# Patient Record
Sex: Male | Born: 1980 | Race: White | Hispanic: No | Marital: Single | Smoking: Current every day smoker
Health system: Southern US, Community
[De-identification: ages and names within clinical notes are randomized; demographics above are authoritative.]

## PROBLEM LIST (undated history)

## (undated) HISTORY — PX: SPINAL FUSION: SHX223

---

## 2006-07-16 ENCOUNTER — Emergency Department (HOSPITAL_COMMUNITY): Admission: EM | Admit: 2006-07-16 | Discharge: 2006-07-17 | Payer: Self-pay | Admitting: Emergency Medicine

## 2010-08-03 ENCOUNTER — Emergency Department (INDEPENDENT_AMBULATORY_CARE_PROVIDER_SITE_OTHER): Payer: PRIVATE HEALTH INSURANCE

## 2010-08-03 ENCOUNTER — Emergency Department (HOSPITAL_BASED_OUTPATIENT_CLINIC_OR_DEPARTMENT_OTHER)
Admission: EM | Admit: 2010-08-03 | Discharge: 2010-08-03 | Disposition: A | Discharge status: Home / Self Care | Payer: PRIVATE HEALTH INSURANCE | Attending: Emergency Medicine | Admitting: Emergency Medicine

## 2010-08-03 DIAGNOSIS — M542 Cervicalgia: Secondary | ICD-10-CM

## 2010-09-21 ENCOUNTER — Ambulatory Visit (INDEPENDENT_AMBULATORY_CARE_PROVIDER_SITE_OTHER): Payer: PRIVATE HEALTH INSURANCE | Admitting: Family Medicine

## 2010-09-21 ENCOUNTER — Encounter: Payer: Self-pay | Admitting: Family Medicine

## 2010-09-21 VITALS — BP 130/87 | HR 89 | Ht 72.0 in | Wt 190.0 lb

## 2010-09-21 DIAGNOSIS — R252 Cramp and spasm: Secondary | ICD-10-CM

## 2010-09-21 DIAGNOSIS — M542 Cervicalgia: Secondary | ICD-10-CM

## 2010-09-21 MED ORDER — CYCLOBENZAPRINE HCL 5 MG PO TABS: 5.0000 mg | ORAL_TABLET | Freq: Three times a day (TID) | ORAL | Status: AC | PRN

## 2010-09-21 MED ORDER — PREDNISONE (PAK) 10 MG PO TABS: ORAL_TABLET | ORAL | Status: DC

## 2010-09-21 MED ORDER — MELOXICAM 15 MG PO TABS: 15.0000 mg | ORAL_TABLET | Freq: Every day | ORAL | Status: AC

## 2010-09-21 MED ORDER — TRAMADOL HCL 50 MG PO TABS: 50.0000 mg | ORAL_TABLET | Freq: Three times a day (TID) | ORAL | Status: AC | PRN

## 2010-09-21 NOTE — Progress Notes (Signed)
  Subjective:    Patient ID: Gilbert Howard, male    DOB: 12-May-1981, 29 y.o.   MRN: [id number]  HPI 30 yo M here for right sided neck pain  Patient reports no known injury. States about 2 1/2 - 3 months ago started to have intermittent sharp right sided neck pain about the trapezius that radiated into neck, associated with headaches. Happens about every other day. Lasts for several hours. No numbness or tingling. Radiated into right arm once. Pain improved with right arm overhead. H/o cervical strain a few years ago but this resolved. Taking ibuprofen regularly which helps some Went to ED on 3/15 and had c-spine CT which was negative for any abnormalities.  A remote x-ray was also normal. Given percocet and naprosyn from ED - took percocet but not naprosyn. No prior treatment other than ED.  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  Sweats a great deal and cannot stay hydrated despite drinking lots of water.  Salt caked on body by end of day.  Calves cramp up as well.  History reviewed. No pertinent past medical history.  No current outpatient prescriptions on file prior to visit.    History reviewed. No pertinent past surgical history.  Allergies  Allergen Reactions  . Codeine     History   Social History  . Marital Status: Single    Spouse Name: N/A    Number of Children: N/A  . Years of Education: N/A   Occupational History  . Not on file.   Social History Main Topics  . Smoking status: Current Everyday Smoker  . Smokeless tobacco: Not on file  . Alcohol Use: Not on file  . Drug Use: Not on file  . Sexually Active: Not on file   Other Topics Concern  . Not on file   Social History Narrative  . No narrative on file    Family History  Problem Relation Age of Onset  . Diabetes Neg Hx   . Heart attack Neg Hx   . Hypertension Neg Hx     BP 130/87  Pulse 89  Ht 6' (1.829 m)  Wt 190 lb (86.183 kg)  BMI 25.77  kg/m2  Review of Systems See HPI above.    Objective:   Physical Exam Gen: NAD Neck: No gross deformity, swelling, or bruising. TTP right paraspinal cervical region and in trapezius.  No midline/bony or left paraspinal TTP. FROM without pain including lateral rotations. Strength 5/5 BUEs Trace reflexes bilaterally in biceps, triceps, brachioradialis tendons Sensation intact to light touch Negative spurlings bilaterally.  R shoulder: FROM without pain or weakness Negative hawkins 5/5 strength on empty can, resisted IR and ER without pain in shoulder.     Assessment & Plan:  1. Neck pain - without injury and normal CT, this is most likely cervical strain that has persisted.  Though bulging disc with radiculopathy is possible.  Will treat conservatively initially.  Prednisone dose pack with transition to mobic.  Tramadol as needed for pain, flexeril for muscle spasms.  Start PT for up to 6 weeks for rom, stretching, exercises, modalities.  F/u in 6 weeks.  If not improving will consider MRI.  2. Muscle cramps - 2/2 dehydration, heavy salt and sweat loss.  Discussed measures to replete fluids and electrolytes during work.  Discussed weighing himself before and afterwards would be beneficial as well.  See instructions for details.

## 2010-09-21 NOTE — Assessment & Plan Note (Signed)
Without injury and normal CT, this is most likely cervical strain that has persisted.  Though bulging disc with radiculopathy is possible.  Will treat conservatively initially.  Prednisone dose pack with transition to mobic.  Tramadol as needed for pain, flexeril for muscle spasms.  Start PT for up to 6 weeks for rom, stretching, exercises, modalities.  F/u in 6 weeks.  If not improving will consider MRI.

## 2010-09-21 NOTE — Patient Instructions (Signed)
Start prednisone 6 day dose pack with food as directed. The day after you finish prednisone, start meloxicam 15mg  daily with food for pain and to relieve irritation/inflammation of the nerve. Flexeril three times a day as needed for muscle spasms (can make you sleepy - if so do not drive while taking this). Tramadol for severe pain (no driving on this medicine). Start physical therapy - go at least 2-3 visits but can go up to 6 total weeks. Regardless, do exercises and stretches that they show you. Heat 15 minutes at a time 3-4 times a day to help with spasms. Watch head position when on computers, texting, when sleeping in bed - should in line with back to prevent further nerve traction and irritation. Consider home traction unit if you get benefit with this in physical therapy. If not improving we will consider an MRI, injections, other medications, or surgery depending on how you are doing and what further imaging shows.  For muscle cramps you should drink 20 ounces of water 2 hours prior to work, 8 ounces 15 minutes before work.  Then alternate gatorade and water hourly and drink 4-8 ounces each hour. If possible, weight yourself before work and after work - the difference in pounds (if you've lost weight) should correlate to you needing to drink 20 ounces more for every pound you lost. You can add a tablespoon of salt to gatorade as well. Follow up with me in 6 weeks.

## 2010-09-21 NOTE — Assessment & Plan Note (Signed)
2/2 dehydration, heavy salt and sweat loss.  Discussed measures to replete fluids and electrolytes during work.  Discussed weighing himself before and afterwards would be beneficial as well.  See instructions for details.

## 2010-09-22 ENCOUNTER — Emergency Department (HOSPITAL_BASED_OUTPATIENT_CLINIC_OR_DEPARTMENT_OTHER)
Admission: EM | Admit: 2010-09-22 | Discharge: 2010-09-23 | Disposition: A | Discharge status: Home / Self Care | Payer: Worker's Compensation | Attending: Emergency Medicine | Admitting: Emergency Medicine

## 2010-09-22 DIAGNOSIS — Z79899 Other long term (current) drug therapy: Secondary | ICD-10-CM | POA: Insufficient documentation

## 2010-09-22 DIAGNOSIS — Y9289 Other specified places as the place of occurrence of the external cause: Secondary | ICD-10-CM | POA: Insufficient documentation

## 2010-09-22 DIAGNOSIS — X500XXA Overexertion from strenuous movement or load, initial encounter: Secondary | ICD-10-CM | POA: Insufficient documentation

## 2010-09-22 DIAGNOSIS — F17200 Nicotine dependence, unspecified, uncomplicated: Secondary | ICD-10-CM | POA: Insufficient documentation

## 2010-09-22 DIAGNOSIS — M25519 Pain in unspecified shoulder: Secondary | ICD-10-CM | POA: Insufficient documentation

## 2010-09-23 ENCOUNTER — Emergency Department (INDEPENDENT_AMBULATORY_CARE_PROVIDER_SITE_OTHER): Payer: Worker's Compensation

## 2010-09-23 DIAGNOSIS — X500XXA Overexertion from strenuous movement or load, initial encounter: Secondary | ICD-10-CM

## 2010-09-23 DIAGNOSIS — M25579 Pain in unspecified ankle and joints of unspecified foot: Secondary | ICD-10-CM

## 2010-11-02 ENCOUNTER — Ambulatory Visit: Payer: PRIVATE HEALTH INSURANCE | Admitting: Family Medicine

## 2014-07-29 ENCOUNTER — Other Ambulatory Visit: Payer: Self-pay | Admitting: Physician Assistant

## 2014-07-29 DIAGNOSIS — M545 Low back pain: Secondary | ICD-10-CM

## 2014-08-03 ENCOUNTER — Ambulatory Visit
Admission: RE | Admit: 2014-08-03 | Discharge: 2014-08-03 | Disposition: A | Discharge status: Home / Self Care | Payer: BLUE CROSS/BLUE SHIELD | Source: Ambulatory Visit | Attending: Physician Assistant | Admitting: Physician Assistant

## 2014-08-03 DIAGNOSIS — M545 Low back pain: Secondary | ICD-10-CM

## 2015-03-25 ENCOUNTER — Other Ambulatory Visit: Payer: Self-pay | Admitting: Orthopedic Surgery

## 2015-03-25 DIAGNOSIS — Q762 Congenital spondylolisthesis: Secondary | ICD-10-CM

## 2015-04-13 ENCOUNTER — Ambulatory Visit
Admission: RE | Admit: 2015-04-13 | Discharge: 2015-04-13 | Disposition: A | Discharge status: Home / Self Care | Payer: BLUE CROSS/BLUE SHIELD | Source: Ambulatory Visit | Attending: Orthopedic Surgery | Admitting: Orthopedic Surgery

## 2015-04-13 DIAGNOSIS — Q762 Congenital spondylolisthesis: Secondary | ICD-10-CM

## 2015-12-08 ENCOUNTER — Encounter: Payer: Self-pay | Admitting: Emergency Medicine

## 2015-12-08 ENCOUNTER — Emergency Department (INDEPENDENT_AMBULATORY_CARE_PROVIDER_SITE_OTHER)
Admission: EM | Admit: 2015-12-08 | Discharge: 2015-12-08 | Disposition: A | Discharge status: Home / Self Care | Payer: Self-pay | Source: Home / Self Care | Attending: Family Medicine | Admitting: Family Medicine

## 2015-12-08 DIAGNOSIS — Z720 Tobacco use: Secondary | ICD-10-CM

## 2015-12-08 DIAGNOSIS — F17200 Nicotine dependence, unspecified, uncomplicated: Secondary | ICD-10-CM

## 2015-12-08 DIAGNOSIS — J069 Acute upper respiratory infection, unspecified: Secondary | ICD-10-CM

## 2015-12-08 DIAGNOSIS — Z77120 Contact with and (suspected) exposure to mold (toxic): Secondary | ICD-10-CM

## 2015-12-08 MED ORDER — BENZONATATE 100 MG PO CAPS: 100.0000 mg | ORAL_CAPSULE | Freq: Three times a day (TID) | ORAL | Status: DC

## 2015-12-08 MED ORDER — AZITHROMYCIN 250 MG PO TABS: 250.0000 mg | ORAL_TABLET | Freq: Every day | ORAL | Status: DC

## 2015-12-08 NOTE — ED Notes (Signed)
Productive cough with white mucus x 2 weeks, sinus headache, exposure to mold, lungs feel cool on deep inhalation.

## 2015-12-08 NOTE — Discharge Instructions (Signed)
Cool Mist Vaporizers  Vaporizers may help relieve the symptoms of a cough and cold. They add moisture to the air, which helps mucus to become thinner and less sticky. This makes it easier to breathe and cough up secretions. Cool mist vaporizers do not cause serious burns like hot mist vaporizers, which may also be called steamers or humidifiers. Vaporizers have not been proven to help with colds. You should not use a vaporizer if you are allergic to mold.  HOME CARE INSTRUCTIONS  · Follow the package instructions for the vaporizer.  · Do not use anything other than distilled water in the vaporizer.  · Do not run the vaporizer all of the time. This can cause mold or bacteria to grow in the vaporizer.  · Clean the vaporizer after each time it is used.  · Clean and dry the vaporizer well before storing it.  · Stop using the vaporizer if worsening respiratory symptoms develop.     This information is not intended to replace advice given to you by your health care provider. Make sure you discuss any questions you have with your health care provider.     Document Released: 02/02/2004 Document Revised: 05/12/2013 Document Reviewed: 09/24/2012  Elsevier Interactive Patient Education ©2016 [employer]

## 2015-12-08 NOTE — ED Provider Notes (Signed)
CSN: [id number]     Arrival date & time 12/08/15  1109 History   First MD Initiated Contact with Patient 12/08/15 1131     Chief Complaint  Patient presents with  . Cough   (Consider location/radiation/quality/duration/timing/severity/associated sxs/prior Treatment) HPI  Gilbert Howard is a 35 y.o. male presenting to UC with c/o mildly productive cough that is worse in the morning, white sputum for about 2 weeks.  Pt also c/o intermittent sinus headaches and a "cool" feeling when he takes a deep breath. He notes he is currently renting a house and is concerned he may have mold in the house.  He has not tried anything for his symptoms. He does report being a daily cigarette smoker. Denies fever, chills, n/v/d. He does not have a PCP as he notes he is typically healthy.    History reviewed. No pertinent past medical history. History reviewed. No pertinent past surgical history. Family History  Problem Relation Age of Onset  . Diabetes Neg Hx   . Heart attack Neg Hx   . Hypertension Neg Hx    Social History  Substance Use Topics  . Smoking status: Current Every Day Smoker -- 2.00 packs/day for 25 years    Types: Cigarettes  . Smokeless tobacco: None  . Alcohol Use: None    Review of Systems  Constitutional: Negative for fever, chills, diaphoresis and fatigue.  HENT: Positive for congestion and rhinorrhea. Negative for ear pain, sore throat, trouble swallowing and voice change.   Respiratory: Positive for cough. Negative for chest tightness and shortness of breath.   Cardiovascular: Negative for chest pain and palpitations.  Gastrointestinal: Negative for nausea, vomiting, abdominal pain and diarrhea.  Musculoskeletal: Negative for myalgias, back pain and arthralgias.  Skin: Negative for rash.  Neurological: Positive for headaches. Negative for dizziness and light-headedness.    Allergies  Codeine  Home Medications   Prior to Admission medications   Medication Sig Start Date  End Date Taking? Authorizing Provider  cyclobenzaprine (FLEXERIL) 10 MG tablet Take 10 mg by mouth 3 (three) times daily as needed for muscle spasms.   Yes Historical Provider, MD  gabapentin (NEURONTIN) 300 MG capsule Take 300 mg by mouth 3 (three) times daily.   Yes Historical Provider, MD  HYDROcodone-acetaminophen (NORCO/VICODIN) 5-325 MG tablet Take 1 tablet by mouth every 6 (six) hours as needed for moderate pain.   Yes Historical Provider, MD  methocarbamol (ROBAXIN) 500 MG tablet Take 500 mg by mouth 4 (four) times daily.   Yes Historical Provider, MD  azithromycin (ZITHROMAX) 250 MG tablet Take 1 tablet (250 mg total) by mouth daily. Take first 2 tablets together, then 1 every day until finished. 12/08/15   Junius Finner, PA-C  benzonatate (TESSALON) 100 MG capsule Take 1-2 capsules (100-200 mg total) by mouth every 8 (eight) hours. 12/08/15   Junius Finner, PA-C  predniSONE (DELTASONE) 10 MG tablet pack 6 tabs po day 1, 5 tabs po day 2, 4 tabs po day 3, 3 tabs po day 4, 2 tabs po day 5, 1 tab po day 6  09/21/10   Lenda Kelp, MD   Meds Ordered and Administered this Visit  Medications - No data to display  BP 125/88 mmHg  Pulse 93  Temp(Src) 97.9 F (36.6 C) (Oral)  Ht 6' (1.829 m)  Wt 208 lb (94.348 kg)  BMI 28.20 kg/m2  SpO2 98% No data found.   Physical Exam  Constitutional: He appears well-developed and well-nourished.  HENT:  Head: Normocephalic  and atraumatic.  Right Ear: Tympanic membrane normal.  Left Ear: Tympanic membrane normal.  Nose: Nose normal. Right sinus exhibits no maxillary sinus tenderness and no frontal sinus tenderness. Left sinus exhibits no maxillary sinus tenderness and no frontal sinus tenderness.  Mouth/Throat: Uvula is midline, oropharynx is clear and moist and mucous membranes are normal.  Eyes: Conjunctivae are normal. No scleral icterus.  Neck: Normal range of motion. Neck supple.  Cardiovascular: Normal rate, regular rhythm and normal heart  sounds.   Pulmonary/Chest: Effort normal and breath sounds normal. No stridor. No respiratory distress. He has no wheezes. He has no rales.  No respiratory distress, able to speak in full sentences w/o difficulty. Lungs: CTAB  Abdominal: Soft. He exhibits no distension. There is no tenderness.  Musculoskeletal: Normal range of motion.  Lymphadenopathy:    He has no cervical adenopathy.  Neurological: He is alert.  Skin: Skin is warm and dry.  Nursing note and vitals reviewed.   ED Course  Procedures (including critical care time)  Labs Review Labs Reviewed - No data to display  Imaging Review No results found.    MDM   1. Acute upper respiratory infection   2. Suspected exposure to mold   3. Current smoker    Pt c/o productive cough for about 2 weeks. Current smoker. Pt concerned he may have been exposed to mold. No fever, nausea or vomiting. Lungs: CTAB.  Rx: Azithromycin and Tessalon  Encouraged f/u with PCP in 7-10 days if not improving, sooner if worsening. Resource guide for PCP provided. [facility] Patient verbalized understanding and agreement with treatment plan.     Junius Finnerrin O'Malley, PA-C 12/08/15 1145

## 2015-12-12 ENCOUNTER — Telehealth: Payer: Self-pay | Admitting: Emergency Medicine

## 2015-12-12 NOTE — Telephone Encounter (Signed)
Patient states he has improved slightly; discussed making appt.with suggested pcp.

## 2015-12-19 ENCOUNTER — Encounter: Payer: Self-pay | Admitting: Family Medicine

## 2015-12-19 ENCOUNTER — Ambulatory Visit (INDEPENDENT_AMBULATORY_CARE_PROVIDER_SITE_OTHER): Payer: Self-pay | Admitting: Family Medicine

## 2015-12-19 DIAGNOSIS — Z77120 Contact with and (suspected) exposure to mold (toxic): Secondary | ICD-10-CM

## 2015-12-19 DIAGNOSIS — J209 Acute bronchitis, unspecified: Secondary | ICD-10-CM

## 2015-12-19 MED ORDER — AMOXICILLIN 500 MG PO CAPS: 500.0000 mg | ORAL_CAPSULE | Freq: Three times a day (TID) | ORAL | 0 refills | Status: DC

## 2015-12-19 MED ORDER — PREDNISONE 5 MG (48) PO TBPK: ORAL_TABLET | ORAL | 0 refills | Status: DC

## 2015-12-19 NOTE — Patient Instructions (Signed)
Thank you for coming in today. Take prednisone and amoxicillin. Use over-the-counter Zyrtec (cetirizine) and a nasal steroid spray like Flonase Rhinocort or Nasonex.  Return in one month or so for blood pressure and symptom recheck Call or go to the emergency room if you get worse, have trouble breathing, have chest pains, or palpitations.    Acute Bronchitis Bronchitis is inflammation of the airways that extend from the windpipe into the lungs (bronchi). The inflammation often causes mucus to develop. This leads to a cough, which is the most common symptom of bronchitis.  In acute bronchitis, the condition usually develops suddenly and goes away over time, usually in a couple weeks. Smoking, allergies, and asthma can make bronchitis worse. Repeated episodes of bronchitis may cause further lung problems.  CAUSES Acute bronchitis is most often caused by the same virus that causes a cold. The virus can spread from person to person (contagious) through coughing, sneezing, and touching contaminated objects. SIGNS AND SYMPTOMS   Cough.   Fever.   Coughing up mucus.   Body aches.   Chest congestion.   Chills.   Shortness of breath.   Sore throat.  DIAGNOSIS  Acute bronchitis is usually diagnosed through a physical exam. Your health care provider will also ask you questions about your medical history. Tests, such as chest X-rays, are sometimes done to rule out other conditions.  TREATMENT  Acute bronchitis usually goes away in a couple weeks. Oftentimes, no medical treatment is necessary. Medicines are sometimes given for relief of fever or cough. Antibiotic medicines are usually not needed but may be prescribed in certain situations. In some cases, an inhaler may be recommended to help reduce shortness of breath and control the cough. A cool mist vaporizer may also be used to help thin bronchial secretions and make it easier to clear the chest.  HOME CARE INSTRUCTIONS  Get plenty  of rest.   Drink enough fluids to keep your urine clear or pale yellow (unless you have a medical condition that requires fluid restriction). Increasing fluids may help thin your respiratory secretions (sputum) and reduce chest congestion, and it will prevent dehydration.   Take medicines only as directed by your health care provider.  If you were prescribed an antibiotic medicine, finish it all even if you start to feel better.  Avoid smoking and secondhand smoke. Exposure to cigarette smoke or irritating chemicals will make bronchitis worse. If you are a smoker, consider using nicotine gum or skin patches to help control withdrawal symptoms. Quitting smoking will help your lungs heal faster.   Reduce the chances of another bout of acute bronchitis by washing your hands frequently, avoiding people with cold symptoms, and trying not to touch your hands to your mouth, nose, or eyes.   Keep all follow-up visits as directed by your health care provider.  SEEK MEDICAL CARE IF: Your symptoms do not improve after 1 week of treatment.  SEEK IMMEDIATE MEDICAL CARE IF:  You develop an increased fever or chills.   You have chest pain.   You have severe shortness of breath.  You have bloody sputum.   You develop dehydration.  You faint or repeatedly feel like you are going to pass out.  You develop repeated vomiting.  You develop a severe headache. MAKE SURE YOU:   Understand these instructions.  Will watch your condition.  Will get help right away if you are not doing well or get worse.   This information is not intended to replace  advice given to you by your health care provider. Make sure you discuss any questions you have with your health care provider.   Document Released: 06/14/2004 Document Revised: 05/28/2014 Document Reviewed: 10/28/2012 Elsevier Interactive Patient Education [employer].

## 2015-12-19 NOTE — Progress Notes (Signed)
Gilbert Howard is a 35 y.o. male who presents to [facility] today for cough congestion and runny nose nasal congestion. Symptoms have been ongoing now for a few weeks. He is concerned of mold exposure. There is mold in his apartment and he has a report from his apartment testing that shows a significantly elevated level of mold spores. He was seen in urgent care recently and given Tessalon Perles and azithromycin which helped only temporarily. He denies any fevers or chills nausea vomiting or diarrhea. He does note some mild shortness of breath and chest tightness. He continues to smoke about 1.5 packs per day. He has about a 30-pack-year history.   History reviewed. No pertinent past medical history. Past Surgical History:  Procedure Laterality Date  . SPINAL FUSION     Social History  Substance Use Topics  . Smoking status: Current Every Day Smoker    Packs/day: 2.00    Years: 25.00    Types: Cigarettes  . Smokeless tobacco: Never Used  . Alcohol use No   family history includes Alcohol abuse in his maternal grandfather; Depression in his mother; Diabetes in his mother; Heart disease in his maternal grandfather; Hyperlipidemia in his father, maternal grandfather, and mother; Hypertension in his mother.  ROS as above: Positive for headache fatigue runny nose congestion wheezing cough No , visual changes, nausea, vomiting, diarrhea, constipation, , abdominal pain, skin rash, fevers, chills, night sweats, weight loss, swollen lymph nodes, , joint swelling, muscle aches, mood changes, visual or auditory hallucinations.    Medications: Current Outpatient Prescriptions  Medication Sig Dispense Refill  . benzonatate (TESSALON) 100 MG capsule Take 1-2 capsules (100-200 mg total) by mouth every 8 (eight) hours. 21 capsule 0  . cyclobenzaprine (FLEXERIL) 10 MG tablet Take  10 mg by mouth 3 (three) times daily as needed for muscle spasms.    Marland Kitchen gabapentin (NEURONTIN) 300 MG capsule Take 300 mg by mouth 3 (three) times daily.    Marland Kitchen HYDROcodone-acetaminophen (NORCO/VICODIN) 5-325 MG tablet Take 1 tablet by mouth every 6 (six) hours as needed for moderate pain.    . methocarbamol (ROBAXIN) 500 MG tablet Take 500 mg by mouth 4 (four) times daily.    Marland Kitchen amoxicillin (AMOXIL) 500 MG capsule Take 1 capsule (500 mg total) by mouth 3 (three) times daily. 30 capsule 0  . predniSONE (STERAPRED UNI-PAK 48 TAB) 5 MG (48) TBPK tablet 12 day dosepack po 48 tablet 0   No current facility-administered medications for this visit.    Allergies  Allergen Reactions  . Codeine      Exam:  BP (!) 167/107   Pulse 78   Temp 98.1 F (36.7 C) (Oral)   Ht 6' (1.829 m)   Wt 214 lb (97.1 kg)   SpO2 99%   BMI 29.02 kg/m  Gen: Well NAD HEENT: EOMI,  MMM Clear nasal discharge. Normal posterior pharynx and tympanic membranes. Lungs: Normal work of breathing. CTABL Heart: RRR no MRG Abd: NABS, Soft. Nondistended, Nontender Exts: Brisk capillary refill, warm and well perfused.   Attached fungal spore count document    No results found for this or any previous visit (from the past 24 hour(s)). No results found.    Assessment and Plan: 35 y.o. male with bronchitis very likely due to mold exposure. Smoking certainly is a contributing factor. Recommend treatment with prednisone and amoxicillin as well as her tach and nasal steroid. Recommend smoking cessation. Additionally  recommend removing mold for the apartment or moving apartments.  Blood pressure also elevated. At the last visit in urgent care if blood pressure was normal. Will recheck in a month and treat if still elevated.   No orders of the defined types were placed in this encounter.   Discussed warning signs or symptoms. Please see discharge instructions. Patient expresses understanding.
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
## 2017-10-14 ENCOUNTER — Other Ambulatory Visit: Payer: Self-pay

## 2017-10-14 ENCOUNTER — Emergency Department (HOSPITAL_BASED_OUTPATIENT_CLINIC_OR_DEPARTMENT_OTHER)
Admission: EM | Admit: 2017-10-14 | Discharge: 2017-10-14 | Disposition: A | Discharge status: Home / Self Care | Payer: 59 | Attending: Emergency Medicine | Admitting: Emergency Medicine

## 2017-10-14 ENCOUNTER — Encounter (HOSPITAL_BASED_OUTPATIENT_CLINIC_OR_DEPARTMENT_OTHER): Payer: Self-pay | Admitting: *Deleted

## 2017-10-14 DIAGNOSIS — S60351A Superficial foreign body of right thumb, initial encounter: Secondary | ICD-10-CM | POA: Insufficient documentation

## 2017-10-14 DIAGNOSIS — W268XXA Contact with other sharp object(s), not elsewhere classified, initial encounter: Secondary | ICD-10-CM | POA: Insufficient documentation

## 2017-10-14 DIAGNOSIS — F17210 Nicotine dependence, cigarettes, uncomplicated: Secondary | ICD-10-CM | POA: Insufficient documentation

## 2017-10-14 DIAGNOSIS — Z79899 Other long term (current) drug therapy: Secondary | ICD-10-CM | POA: Insufficient documentation

## 2017-10-14 DIAGNOSIS — Y999 Unspecified external cause status: Secondary | ICD-10-CM | POA: Insufficient documentation

## 2017-10-14 DIAGNOSIS — Z23 Encounter for immunization: Secondary | ICD-10-CM | POA: Insufficient documentation

## 2017-10-14 DIAGNOSIS — Y9389 Activity, other specified: Secondary | ICD-10-CM | POA: Insufficient documentation

## 2017-10-14 DIAGNOSIS — Y929 Unspecified place or not applicable: Secondary | ICD-10-CM | POA: Insufficient documentation

## 2017-10-14 DIAGNOSIS — S6991XA Unspecified injury of right wrist, hand and finger(s), initial encounter: Secondary | ICD-10-CM

## 2017-10-14 MED ORDER — CEPHALEXIN 500 MG PO CAPS: 500.0000 mg | ORAL_CAPSULE | Freq: Four times a day (QID) | ORAL | 0 refills | Status: DC

## 2017-10-14 MED ORDER — LIDOCAINE HCL (PF) 2 % IJ SOLN
INTRAMUSCULAR | Status: AC
  Administered 2017-10-14: 5 mL
  Filled 2017-10-14: qty 2

## 2017-10-14 MED ORDER — LIDOCAINE HCL 2 % IJ SOLN
20.0000 mL | Freq: Once | INTRAMUSCULAR | Status: AC
  Administered 2017-10-14: 5 mL
  Filled 2017-10-14: qty 20

## 2017-10-14 MED ORDER — TETANUS-DIPHTH-ACELL PERTUSSIS 5-2.5-18.5 LF-MCG/0.5 IM SUSP
0.5000 mL | Freq: Once | INTRAMUSCULAR | Status: AC
  Administered 2017-10-14: 0.5 mL via INTRAMUSCULAR
  Filled 2017-10-14: qty 0.5

## 2017-10-14 NOTE — ED Notes (Signed)
Wound cleaned and soaked after fish hook was removed.

## 2017-10-14 NOTE — Discharge Instructions (Addendum)
You were seen today and had a fishhook injury to the right thumb.  This was removed.  Take antibiotics as prescribed.  Monitor for signs and symptoms of infection.  If you note drainage, redness, any new or worsening symptoms you need to be reevaluated.

## 2017-10-14 NOTE — ED Notes (Signed)
ED Provider at bedside to remove hook.

## 2017-10-14 NOTE — ED Triage Notes (Signed)
Pt with fish hook to his right thumb. Td is not update pt c/o throbbing pain to finger.

## 2017-10-14 NOTE — ED Provider Notes (Signed)
[facility] EMERGENCY DEPARTMENT Provider Note   CSN: [id number] Arrival date & time: 10/14/17  0219     History   Chief Complaint Chief Complaint  Patient presents with  . fish hook in right thumb    HPI Gilbert Howard is a 37 y.o. male.  HPI  This is a 36 year old male who presents with a fishhook in his right thumb.  He is right-handed.  Patient reports that he was fishing this morning when he got a fishhook in his right thumb when trying to get it out of the mouth of the fish.  Reports 3 out of 10 pain.  Denies any numbness or tingling.  Unknown last tetanus shot.  Denies other injury.  History reviewed. No pertinent past medical history.  Patient Active Problem List   Diagnosis Date Noted  . Acute bronchitis 12/19/2015  . Mold exposure 12/19/2015  . Neck pain 09/21/2010  . Cramps, muscle, general 09/21/2010    Past Surgical History:  Procedure Laterality Date  . SPINAL FUSION          Home Medications    Prior to Admission medications   Medication Sig Start Date End Date Taking? Authorizing Provider  amoxicillin (AMOXIL) 500 MG capsule Take 1 capsule (500 mg total) by mouth 3 (three) times daily. 12/19/15   Rodolph Bong, MD  benzonatate (TESSALON) 100 MG capsule Take 1-2 capsules (100-200 mg total) by mouth every 8 (eight) hours. 12/08/15   Lurene Shadow, PA-C  cephALEXin (KEFLEX) 500 MG capsule Take 1 capsule (500 mg total) by mouth 4 (four) times daily. 10/14/17   Horton, Mayer Masker, MD  cyclobenzaprine (FLEXERIL) 10 MG tablet Take 10 mg by mouth 3 (three) times daily as needed for muscle spasms.    [provider]  gabapentin (NEURONTIN) 300 MG capsule Take 300 mg by mouth 3 (three) times daily.    [provider]  HYDROcodone-acetaminophen (NORCO/VICODIN) 5-325 MG tablet Take 1 tablet by mouth every 6 (six) hours as needed for moderate pain.    [provider]  methocarbamol (ROBAXIN) 500 MG tablet Take 500 mg by mouth 4  (four) times daily.    [provider]  predniSONE (STERAPRED UNI-PAK 48 TAB) 5 MG (48) TBPK tablet 12 day dosepack po 12/19/15   Rodolph Bong, MD    Family History Family History  Problem Relation Age of Onset  . Diabetes Mother   . Depression Mother   . Hyperlipidemia Mother   . Hypertension Mother   . Hyperlipidemia Father   . Alcohol abuse Maternal Grandfather   . Heart disease Maternal Grandfather   . Hyperlipidemia Maternal Grandfather   . Heart attack Neg Hx     Social History Social History   Tobacco Use  . Smoking status: Current Every Day Smoker    Packs/day: 2.00    Years: 25.00    Pack years: 50.00    Types: Cigarettes  . Smokeless tobacco: Never Used  Substance Use Topics  . Alcohol use: No  . Drug use: No     Allergies   Codeine   Review of Systems Review of Systems  Skin: Positive for wound.  Neurological: Negative for numbness.  All other systems reviewed and are negative.    Physical Exam Updated Vital Signs BP 131/87 (BP Location: Left Arm)   Pulse 79   Temp 98.5 F (36.9 C) (Oral)   Resp 19   Ht 6' (1.829 m)   Wt 97.5 kg (215 lb)  SpO2 100%   BMI 29.16 kg/m   Physical Exam  Constitutional: He is oriented to person, place, and time. He appears well-developed and well-nourished. No distress.  HENT:  Head: Normocephalic and atraumatic.  Neck: Neck supple.  Cardiovascular: Normal rate and regular rhythm.  Pulmonary/Chest: Effort normal. No respiratory distress.  Abdominal: Soft. There is no tenderness.  Musculoskeletal: He exhibits no edema.  Fishhook noted in the lateral aspect of the right thumb just adjacent to the nail, no significant erythema, no drainage  Neurological: He is alert and oriented to person, place, and time.  Skin: Skin is warm and dry.  Psychiatric: He has a normal mood and affect.  Nursing note and vitals reviewed.    ED Treatments / Results  Labs (all labs ordered are listed, but only abnormal  results are displayed) Labs Reviewed - No data to display  EKG None  Radiology No results found.  Procedures .Foreign Body Removal Date/Time: 10/14/2017 6:04 AM Performed by: Shon Baton, MD Authorized by: Shon Baton, MD  Consent: Verbal consent obtained. Written consent not obtained. Risks and benefits: risks, benefits and alternatives were discussed Consent given by: patient Patient identity confirmed: verbally with patient Body area: skin General location: upper extremity Location details: right thumb Anesthesia: digital block  Anesthesia: Local Anesthetic: lidocaine 2% without epinephrine Anesthetic total: 3 mL  Sedation: Patient sedated: no  Patient restrained: no Patient cooperative: yes Localization method: visualized Removal mechanism: hemostat Dressing: dressing applied Tendon involvement: none Depth: subcutaneous Complexity: simple 1 objects recovered. Objects recovered: Fish hook Post-procedure assessment: foreign body removed Patient tolerance: Patient tolerated the procedure well with no immediate complications Comments: Digital block was performed.  A hook was advanced through the skin and and barb was cut so that it could be advanced all the way out.  No immediate complications.   (including critical care time)  Medications Ordered in ED Medications  lidocaine (XYLOCAINE) 2 % (with pres) injection 400 mg (has no administration in time range)  Tdap (BOOSTRIX) injection 0.5 mL (0.5 mLs Intramuscular Given 10/14/17 0308)  lidocaine (XYLOCAINE) 2 % injection (5 mLs  Given 10/14/17 0311)     Initial Impression / Assessment and Plan / ED Course  I have reviewed the triage vital signs and the nursing notes.  Pertinent labs & imaging results that were available during my care of the patient were reviewed by me and considered in my medical decision making (see chart for details).     Patient presents with a fishhook in the right thumb.   This was removed without difficulty.  Tetanus was updated.  Thumb was soaked.  Patient will be discharged with antibiotics given contamination.  Will discharge with Keflex.  Discussed with patient monitoring for signs and symptoms of infection.  After history, exam, and medical workup I feel the patient has been appropriately medically screened and is safe for discharge home. Pertinent diagnoses were discussed with the patient. Patient was given return precautions.   Final Clinical Impressions(s) / ED Diagnoses   Final diagnoses:  Fish hook injury of right thumb, initial encounter    ED Discharge Orders        Ordered    cephALEXin (KEFLEX) 500 MG capsule  4 times daily     10/14/17 0409       Horton, Mayer Masker, MD 10/14/17 [phone]

## 2021-01-13 ENCOUNTER — Other Ambulatory Visit: Payer: Self-pay

## 2021-01-13 ENCOUNTER — Ambulatory Visit (HOSPITAL_COMMUNITY)
Admission: EM | Admit: 2021-01-13 | Discharge: 2021-01-13 | Disposition: A | Discharge status: Home / Self Care | Payer: 59

## 2021-01-13 DIAGNOSIS — F411 Generalized anxiety disorder: Secondary | ICD-10-CM | POA: Diagnosis not present
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
## 2021-01-13 NOTE — Discharge Instructions (Addendum)
Please see the resources provided.

## 2021-01-13 NOTE — ED Notes (Signed)
A&O x4. No s/s pain, discomfort, or acute distress noted. No SI, HI, or AVH. Ambulated per self to retrieve belongings. Educated on d/c instructions and f/u care. Verbalized understanding. Escorted to front lobby. Stable at time of d/c

## 2023-02-03 ENCOUNTER — Emergency Department (HOSPITAL_BASED_OUTPATIENT_CLINIC_OR_DEPARTMENT_OTHER): Payer: No Typology Code available for payment source | Admitting: Radiology

## 2023-02-03 ENCOUNTER — Emergency Department (HOSPITAL_BASED_OUTPATIENT_CLINIC_OR_DEPARTMENT_OTHER)
Admission: EM | Admit: 2023-02-03 | Discharge: 2023-02-03 | Disposition: A | Discharge status: Home / Self Care | Payer: No Typology Code available for payment source | Attending: Emergency Medicine | Admitting: Emergency Medicine

## 2023-02-03 DIAGNOSIS — M546 Pain in thoracic spine: Secondary | ICD-10-CM | POA: Diagnosis present

## 2023-02-03 DIAGNOSIS — Y92410 Unspecified street and highway as the place of occurrence of the external cause: Secondary | ICD-10-CM | POA: Diagnosis not present

## 2023-02-03 MED ORDER — CYCLOBENZAPRINE HCL 10 MG PO TABS: 10.0000 mg | ORAL_TABLET | Freq: Three times a day (TID) | ORAL | 0 refills | Status: AC | PRN

## 2023-02-03 MED ORDER — IBUPROFEN 800 MG PO TABS: 800.0000 mg | ORAL_TABLET | Freq: Three times a day (TID) | ORAL | 0 refills | Status: AC
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
## 2023-02-19 ENCOUNTER — Other Ambulatory Visit: Payer: Self-pay | Admitting: Orthopedic Surgery

## 2023-02-19 DIAGNOSIS — Q762 Congenital spondylolisthesis: Secondary | ICD-10-CM

## 2023-02-28 ENCOUNTER — Other Ambulatory Visit: Payer: Self-pay

## 2023-02-28 ENCOUNTER — Encounter (HOSPITAL_BASED_OUTPATIENT_CLINIC_OR_DEPARTMENT_OTHER): Payer: Self-pay | Admitting: Emergency Medicine

## 2023-02-28 DIAGNOSIS — M546 Pain in thoracic spine: Secondary | ICD-10-CM | POA: Diagnosis not present

## 2023-02-28 DIAGNOSIS — M545 Low back pain, unspecified: Secondary | ICD-10-CM | POA: Insufficient documentation

## 2023-02-28 DIAGNOSIS — M542 Cervicalgia: Secondary | ICD-10-CM | POA: Insufficient documentation

## 2023-02-28 DIAGNOSIS — Y92410 Unspecified street and highway as the place of occurrence of the external cause: Secondary | ICD-10-CM | POA: Diagnosis not present
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
## 2023-03-01 ENCOUNTER — Emergency Department (HOSPITAL_BASED_OUTPATIENT_CLINIC_OR_DEPARTMENT_OTHER): Payer: No Typology Code available for payment source

## 2023-03-01 ENCOUNTER — Emergency Department (HOSPITAL_BASED_OUTPATIENT_CLINIC_OR_DEPARTMENT_OTHER)
Admission: EM | Admit: 2023-03-01 | Discharge: 2023-03-01 | Disposition: A | Discharge status: Home / Self Care | Payer: No Typology Code available for payment source | Attending: Emergency Medicine | Admitting: Emergency Medicine

## 2023-03-01 DIAGNOSIS — S161XXA Strain of muscle, fascia and tendon at neck level, initial encounter: Secondary | ICD-10-CM

## 2023-03-01 DIAGNOSIS — S29019A Strain of muscle and tendon of unspecified wall of thorax, initial encounter: Secondary | ICD-10-CM

## 2023-03-01 MED ORDER — HYDROCODONE-ACETAMINOPHEN 5-325 MG PO TABS
2.0000 | ORAL_TABLET | Freq: Once | ORAL | Status: AC
  Administered 2023-03-01: 2 via ORAL
  Filled 2023-03-01: qty 2

## 2023-03-01 MED ORDER — HYDROCODONE-ACETAMINOPHEN 5-325 MG PO TABS: 1.0000 | ORAL_TABLET | Freq: Four times a day (QID) | ORAL | 0 refills | Status: AC | PRN
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
## 2023-03-01 NOTE — Discharge Instructions (Signed)
Begin taking hydrocodone as prescribed as needed for pain.  Follow-up with your spine surgeon next week.

## 2023-03-13 ENCOUNTER — Other Ambulatory Visit: Payer: Self-pay

## 2023-04-04 ENCOUNTER — Other Ambulatory Visit: Payer: Self-pay

## 2023-04-04 ENCOUNTER — Ambulatory Visit: Payer: No Typology Code available for payment source | Attending: Orthopedic Surgery | Admitting: Physical Therapy

## 2023-04-04 ENCOUNTER — Encounter: Payer: Self-pay | Admitting: Physical Therapy

## 2023-04-04 DIAGNOSIS — M62830 Muscle spasm of back: Secondary | ICD-10-CM | POA: Insufficient documentation

## 2023-04-04 DIAGNOSIS — M5459 Other low back pain: Secondary | ICD-10-CM | POA: Insufficient documentation
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
## 2023-04-09 ENCOUNTER — Ambulatory Visit: Payer: No Typology Code available for payment source | Admitting: *Deleted

## 2023-04-09 ENCOUNTER — Encounter: Payer: Self-pay | Admitting: *Deleted

## 2023-04-09 DIAGNOSIS — M5459 Other low back pain: Secondary | ICD-10-CM | POA: Diagnosis not present

## 2023-04-09 DIAGNOSIS — M62830 Muscle spasm of back: Secondary | ICD-10-CM
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
## 2023-04-12 ENCOUNTER — Ambulatory Visit: Payer: No Typology Code available for payment source

## 2023-04-12 DIAGNOSIS — M5459 Other low back pain: Secondary | ICD-10-CM | POA: Diagnosis not present

## 2023-04-12 DIAGNOSIS — M62830 Muscle spasm of back: Secondary | ICD-10-CM

## 2023-04-12 NOTE — Therapy (Signed)
OUTPATIENT PHYSICAL THERAPY THORACOLUMBAR TREATMENT   Patient Name: Gilbert Howard MRN: [id number] DOB:Apr 25, 1981, 42 y.o., male Today's Date: 04/12/2023  END OF SESSION:  PT End of Session - 04/12/23 0806     Visit Number 3    Number of Visits 12    Date for PT Re-Evaluation 05/16/23    PT Start Time 0800    PT Stop Time 0843    PT Time Calculation (min) 43 min    Activity Tolerance Patient tolerated treatment well    Behavior During Therapy [facility] for tasks assessed/performed              History reviewed. No pertinent past medical history. Past Surgical History:  Procedure Laterality Date   SPINAL FUSION     Patient Active Problem List   Diagnosis Date Noted   Acute bronchitis 12/19/2015   Mold exposure 12/19/2015   Neck pain 09/21/2010   Cramps, muscle, general 09/21/2010   REFERRING PROVIDER: Malachy Chamber MD  REFERRING DIAG: Sprain of ligaments of lumbar spine.  Rationale for Evaluation and Treatment: Rehabilitation  THERAPY DIAG:  Other low back pain  Muscle spasm of back  ONSET DATE: 02/03/23.  SUBJECTIVE:                                                                                                                                                                                           SUBJECTIVE STATEMENT: Patient reports that he feels decent today. He felt good for about a day after his last appointment.   PERTINENT HISTORY:  Prior lumbar fusion, 05/18/15: (patient states has done very well since his surgery).  PAIN:  Are you having pain? 4/10, bilateral LB, right > left.  Also, c/o of mid-back and neck.  Discussed with pt following up with doctor if pain continues to persist in these areas.  PRECAUTIONS: None  RED FLAGS: None   WEIGHT BEARING RESTRICTIONS: No  FALLS:  Has patient fallen in last 6 months? 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  DIAGNOSTIC FINDINGS:  03/03/23:   Sequela of L4-S1 decompressive laminectomies and instrumented posterior fusion again noted. Suggestion of interval osseous fusion across the L5-S1 disc and at least partial incorporation of interbody graft at this level with development of adjacent deformity of the inferior half of the posterior L5 cortex with persistent grade I anterolisthesis of L5 on S1 contributes to at least moderate left greater than right neural foraminal stenosis at this level.  2. The right S1 pedicle screw appears to extend medial to the pedicle cortex and exert mass effect on the exiting right S1 nerve root.  3. Multilevel otherwise mild lumbar spondylosis contributing to multilevel otherwise mild neural foraminal stenoses and effacement of subarticular recesses without significant spinal canal compromise otherwise detailed above    POSTURE: No Significant postural limitations  PALPATION: Tender to palpation over bilateral lumbar paraspinal musculature, right > left and over his right SIJ.  LUMBAR ROM:  Active lumbar flexion decreased by 50% and extension to 10 degrees.  LOWER EXTREMITY MMT:    Normal LE strength.  LUMBAR SPECIAL TESTS:  Equal leg lengths.  Pain reproduction with a right SLR test. GAIT: WNL.  TODAY'S TREATMENT:                                                                                                                              DATE:                                    04/12/23 EXERCISE LOG  Exercise Repetitions and Resistance Comments  Nustep  L3 x 16 minutes   Isometric ball press  3 minutes w/ 5 second hold    Lower trunk rotation 15 reps    Bent knee fall out  2 minutes  Alternating LE        Blank cell = exercise not performed today  Manual Therapy Soft Tissue Mobilization: right lumbar paraspinals and QL,  for reduced pain and tone Joint Mobilizations: L2-4, grade I-II CPA's      PATIENT EDUCATION:  Education details:  Person educated:  International aid/development worker:  Education comprehension:   HOME EXERCISE PROGRAM:   ASSESSMENT:  CLINICAL IMPRESSION: Patient was introduced to multiple new interventions for improved lumbar mobility and stability needed for improved function with his daily activities. He required minimal cueing with the isometric ball press for proper biomechanics to facilitate abdominal bracing. Manual therapy focused on grade I-II lumbar joint mobilizations and soft tissue mobilization to his right lumbar paraspinals for reduced pain and tone. He experienced no increase in pain or discomfort with any of today's interventions. He reported that his back felt "a little better" upon the conclusion of treatment. He continues to require skilled physical therapy to address his remaining impairments to return to his prior level of function.    OBJECTIVE IMPAIRMENTS: decreased activity tolerance, decreased ROM, increased muscle spasms, and pain.   ACTIVITY LIMITATIONS: sitting and standing  PERSONAL FACTORS: 1 comorbidity: prior lumbar fusion  are also affecting patient's functional outcome.   REHAB POTENTIAL: Good  CLINICAL DECISION MAKING: Stable/uncomplicated  EVALUATION COMPLEXITY: Low   GOALS:  LONG TERM GOALS: Target date: 05/16/23.  Ind with a HEP.  Goal status: INITIAL  2.  Stand 30 minutes with pain not > 2-3/10.  Goal status: INITIAL  3.  Perform ADL's with  pain not > 2-3/10.  Goal status: INITIAL PLAN:  PT FREQUENCY: 2x/week  PT DURATION: 6 weeks  PLANNED INTERVENTIONS: 97110-Therapeutic exercises, 97530- Therapeutic activity, [id number]- Neuromuscular re-education, 97535- Self Care, 82956- Manual therapy, 97014- Electrical stimulation (unattended), 97035- Ultrasound, Patient/Family education, Cryotherapy, and Moist heat.  PLAN FOR NEXT SESSION: Combo e'stim/US,  STW/M, core exercise progression, spinal protection techniques and body mechanics training.     Granville Lewis, PT 04/12/2023, 8:55 AM

## 2023-04-15 ENCOUNTER — Ambulatory Visit: Payer: No Typology Code available for payment source | Admitting: *Deleted

## 2023-04-15 DIAGNOSIS — M5459 Other low back pain: Secondary | ICD-10-CM | POA: Diagnosis not present

## 2023-04-15 DIAGNOSIS — M62830 Muscle spasm of back: Secondary | ICD-10-CM

## 2023-04-15 NOTE — Therapy (Signed)
OUTPATIENT PHYSICAL THERAPY THORACOLUMBAR TREATMENT   Patient Name: Gilbert Howard MRN: [id number] DOB:1981/04/17, 42 y.o., male Today's Date: 04/15/2023  END OF SESSION:  PT End of Session - 04/15/23 1347     Visit Number 4    Number of Visits 12    Date for PT Re-Evaluation 05/16/23    PT Start Time 1347    PT Stop Time 1435    PT Time Calculation (min) 48 min              No past medical history on file. Past Surgical History:  Procedure Laterality Date   SPINAL FUSION     Patient Active Problem List   Diagnosis Date Noted   Acute bronchitis 12/19/2015   Mold exposure 12/19/2015   Neck pain 09/21/2010   Cramps, muscle, general 09/21/2010   REFERRING PROVIDER: Malachy Chamber MD  REFERRING DIAG: Sprain of ligaments of lumbar spine.  Rationale for Evaluation and Treatment: Rehabilitation  THERAPY DIAG:  Other low back pain  Muscle spasm of back  ONSET DATE: 02/03/23.  SUBJECTIVE:                                                                                                                                                                                           SUBJECTIVE STATEMENT: Patient reports that he feels about the same. Reports having shooting pain up his neck after last Rx and into his foot the next Rx. 5/10. Neck pain LT 7/10  PERTINENT HISTORY:  Prior lumbar fusion, 05/18/15: (patient states has done very well since his surgery).  PAIN:  Are you having pain? 5/10, bilateral LB, right > left.  Also, c/o of mid-back and neck.   Discussed with pt following up with doctor if pain continues to persist in these areas.  PRECAUTIONS: None  RED FLAGS: None   WEIGHT BEARING RESTRICTIONS: No  FALLS:  Has patient fallen in last 6 months? 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  DIAGNOSTIC FINDINGS:  03/03/23:   Sequela of L4-S1 decompressive laminectomies and instrumented posterior fusion again noted. Suggestion of interval osseous fusion across the L5-S1 disc and at least partial incorporation of interbody graft at this level with development of adjacent deformity of the inferior half of the posterior L5 cortex with persistent grade I anterolisthesis of L5 on S1 contributes to at least moderate left greater than right neural foraminal stenosis at this level.  2. The right S1 pedicle screw appears  to extend medial to the pedicle cortex and exert mass effect on the exiting right S1 nerve root.  3. Multilevel otherwise mild lumbar spondylosis contributing to multilevel otherwise mild neural foraminal stenoses and effacement of subarticular recesses without significant spinal canal compromise otherwise detailed above    POSTURE: No Significant postural limitations  PALPATION: Tender to palpation over bilateral lumbar paraspinal musculature, right > left and over his right SIJ.  LUMBAR ROM:  Active lumbar flexion decreased by 50% and extension to 10 degrees.  LOWER EXTREMITY MMT:    Normal LE strength.  LUMBAR SPECIAL TESTS:  Equal leg lengths.  Pain reproduction with a right SLR test. GAIT: WNL.  TODAY'S TREATMENT:                                                                                                                              DATE:                                    04/15/23 EXERCISE LOG     Exercise Repetitions and Resistance Comments  Nustep  L3 x 15 minutes   Isometric ball press     Lower trunk rotation    Bent knee fall out   Alternating LE   Standing modified bird-dog Leg raise x 6 each side hold 5 secs. Leg raise x 6 each side hold 5 secs    Blank cell = exercise not performed today  Handout given for HEP  modified bird-dog reps/sets Manual Therapy Soft Tissue Mobilization: RT  lumbar  paraspinals and QL, for reduced pain and tone in LT side lying Joint Mobilizations:  ,       PATIENT EDUCATION:  Education details:  Person educated:  International aid/development worker:  Education comprehension:   HOME EXERCISE PROGRAM:   ASSESSMENT:  CLINICAL IMPRESSION: Patient  arrived today doing fair with LBP. He was able to tolerate therex fairly well today and was able to perform modified bird-dog leg raise for mm activation and did well. Handout given for HEP. STW/TPR performed to RT side LB paras and QL. Notable TP in QL with good release.     OBJECTIVE IMPAIRMENTS: decreased activity tolerance, decreased ROM, increased muscle spasms, and pain.   ACTIVITY LIMITATIONS: sitting and standing  PERSONAL FACTORS: 1 comorbidity: prior lumbar fusion  are also affecting patient's functional outcome.   REHAB POTENTIAL: Good  CLINICAL DECISION MAKING: Stable/uncomplicated  EVALUATION COMPLEXITY: Low   GOALS:  LONG TERM GOALS: Target date: 05/16/23.  Ind with a HEP.  Goal status: INITIAL  2.  Stand 30 minutes with pain not > 2-3/10.  Goal status: INITIAL  3.  Perform ADL's with pain not > 2-3/10.  Goal status: INITIAL PLAN:  PT FREQUENCY: 2x/week  PT DURATION: 6 weeks  PLANNED INTERVENTIONS: 97110-Therapeutic exercises, 97530- Therapeutic activity, [id number]- Neuromuscular re-education, 97535- Self Care, 16109- Manual therapy, 97014- Electrical  stimulation (unattended), [id number]- Ultrasound, Patient/Family education, Cryotherapy, and Moist heat.  PLAN FOR NEXT SESSION: Combo e'stim/US, STW/M, core exercise progression, spinal protection techniques and body mechanics training.     Kashus Karlen,CHRIS, PTA 04/15/2023, 3:03 PM

## 2023-04-23 ENCOUNTER — Ambulatory Visit: Payer: No Typology Code available for payment source | Attending: Orthopedic Surgery

## 2023-04-23 DIAGNOSIS — M62830 Muscle spasm of back: Secondary | ICD-10-CM | POA: Insufficient documentation

## 2023-04-23 DIAGNOSIS — M5459 Other low back pain: Secondary | ICD-10-CM | POA: Insufficient documentation

## 2023-04-23 NOTE — Therapy (Signed)
OUTPATIENT PHYSICAL THERAPY THORACOLUMBAR TREATMENT   Patient Name: Gilbert Howard MRN: [id number] DOB:Feb 14, 1981, 42 y.o., male Today's Date: 04/23/2023  END OF SESSION:  PT End of Session - 04/23/23 0804     Visit Number 5    Number of Visits 12    Date for PT Re-Evaluation 05/16/23    PT Start Time 0803   Patient arrived late to his appointment.   PT Stop Time 0846    PT Time Calculation (min) 43 min    Activity Tolerance Patient tolerated treatment well    Behavior During Therapy [facility] for tasks assessed/performed               History reviewed. No pertinent past medical history. Past Surgical History:  Procedure Laterality Date   SPINAL FUSION     Patient Active Problem List   Diagnosis Date Noted   Acute bronchitis 12/19/2015   Mold exposure 12/19/2015   Neck pain 09/21/2010   Cramps, muscle, general 09/21/2010   REFERRING PROVIDER: Malachy Chamber MD  REFERRING DIAG: Sprain of ligaments of lumbar spine.  Rationale for Evaluation and Treatment: Rehabilitation  THERAPY DIAG:  Other low back pain  Muscle spasm of back  ONSET DATE: 02/03/23.  SUBJECTIVE:                                                                                                                                                                                           SUBJECTIVE STATEMENT: Patient reports that he has been feeling better since his last appointment. He has been doing his HEP and he has been trying to move more at home.   PERTINENT HISTORY:  Prior lumbar fusion, 05/18/15: (patient states has done very well since his surgery).  PAIN:  Are you having pain? 0/10, bilateral LB, right > left.  Also, c/o of mid-back and neck.   PRECAUTIONS: None  RED FLAGS: None   WEIGHT BEARING RESTRICTIONS: No  FALLS:  Has patient fallen in last 6 months? 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  DIAGNOSTIC FINDINGS:  03/03/23:   Sequela of L4-S1 decompressive laminectomies and instrumented posterior fusion again noted. Suggestion of interval osseous fusion across the L5-S1 disc and at least partial incorporation of interbody graft at this level with development of adjacent deformity of the inferior half of the posterior L5 cortex with persistent grade I anterolisthesis of L5 on S1 contributes to at least moderate left greater than right neural foraminal stenosis  at this level.  2. The right S1 pedicle screw appears to extend medial to the pedicle cortex and exert mass effect on the exiting right S1 nerve root.  3. Multilevel otherwise mild lumbar spondylosis contributing to multilevel otherwise mild neural foraminal stenoses and effacement of subarticular recesses without significant spinal canal compromise otherwise detailed above    POSTURE: No Significant postural limitations  PALPATION: Tender to palpation over bilateral lumbar paraspinal musculature, right > left and over his right SIJ.  LUMBAR ROM:  Active lumbar flexion decreased by 50% and extension to 10 degrees.  LOWER EXTREMITY MMT:    Normal LE strength.  LUMBAR SPECIAL TESTS:  Equal leg lengths.  Pain reproduction with a right SLR test. GAIT: WNL.  TODAY'S TREATMENT:                                                                                                                              DATE:                                    04/23/23 EXERCISE LOG  Exercise Repetitions and Resistance Comments  Nustep  L4 x 16 minutes   Standing donkey kick  30 reps each    Standing hip ABD 3 minutes  Alternatign LE  Static stance on foam  3 minutes  Without UE support   Wall squat  20 reps    Squat  15 reps    Standing modified bird dog Leg raise x 12 reps each w/ 5 second hold     Blank  cell = exercise not performed today                                    04/15/23 EXERCISE LOG     Exercise Repetitions and Resistance Comments  Nustep  L3 x 15 minutes   Isometric ball press     Lower trunk rotation    Bent knee fall out   Alternating LE   Standing modified bird-dog Leg raise x 6 each side hold 5 secs. Leg raise x 6 each side hold 5 secs    Blank cell = exercise not performed today  Handout given for HEP  modified bird-dog reps/sets Manual Therapy Soft Tissue Mobilization: RT  lumbar paraspinals and QL, for reduced pain and tone in LT side lying Joint Mobilizations:  ,       PATIENT EDUCATION:  Education details: sitting posture and lifting mechanics Person educated: patient Education method: verbal Education comprehension: patient reported understand    HOME EXERCISE PROGRAM:   ASSESSMENT:  CLINICAL IMPRESSION: Patient was introduced to multiple new standing interventions. He required minimal cueing with standing hip abduction and donkey kicks for proper exercise performance to limit trunk mobility. He experienced a brief increase in bilateral knee pain with  wall squats, but this was significantly reduced with squatting away from the wall. He was educated on proper lifting mechanics to avoid excess strain on his lumbar musculature. He reported feeling good upon the conclusion of treatment. He continues to require skilled physical therapy to address his remaining impairments to return to his prior level of function.   OBJECTIVE IMPAIRMENTS: decreased activity tolerance, decreased ROM, increased muscle spasms, and pain.   ACTIVITY LIMITATIONS: sitting and standing  PERSONAL FACTORS: 1 comorbidity: prior lumbar fusion  are also affecting patient's functional outcome.   REHAB POTENTIAL: Good  CLINICAL DECISION MAKING: Stable/uncomplicated  EVALUATION COMPLEXITY: Low   GOALS:  LONG TERM GOALS: Target date: 05/16/23.  Ind with a HEP.  Goal status:  INITIAL  2.  Stand 30 minutes with pain not > 2-3/10.  Goal status: INITIAL  3.  Perform ADL's with pain not > 2-3/10.  Goal status: INITIAL PLAN:  PT FREQUENCY: 2x/week  PT DURATION: 6 weeks  PLANNED INTERVENTIONS: 97110-Therapeutic exercises, 97530- Therapeutic activity, [id number]- Neuromuscular re-education, 97535- Self Care, 16109- Manual therapy, 97014- Electrical stimulation (unattended), 97035- Ultrasound, Patient/Family education, Cryotherapy, and Moist heat.  PLAN FOR NEXT SESSION: Combo e'stim/US, STW/M, core exercise progression, spinal protection techniques and body mechanics training.    Granville Lewis, PT 04/23/2023, 8:56 AM

## 2023-04-25 ENCOUNTER — Ambulatory Visit: Payer: No Typology Code available for payment source | Admitting: *Deleted

## 2023-04-25 ENCOUNTER — Encounter: Payer: Self-pay | Admitting: *Deleted

## 2023-04-25 DIAGNOSIS — M5459 Other low back pain: Secondary | ICD-10-CM

## 2023-04-25 DIAGNOSIS — M62830 Muscle spasm of back: Secondary | ICD-10-CM

## 2023-04-25 NOTE — Therapy (Signed)
OUTPATIENT PHYSICAL THERAPY THORACOLUMBAR TREATMENT   Patient Name: Gilbert Howard MRN: [id number] DOB:[birth year], 42 y.o., male Today's Date: 04/25/2023  END OF SESSION:  PT End of Session - 04/25/23 0824     Visit Number 6    Number of Visits 12    Date for PT Re-Evaluation 05/16/23    PT Start Time 0800    PT Stop Time 0848    PT Time Calculation (min) 48 min               History reviewed. No pertinent past medical history. Past Surgical History:  Procedure Laterality Date   SPINAL FUSION     Patient Active Problem List   Diagnosis Date Noted   Acute bronchitis 12/19/2015   Mold exposure 12/19/2015   Neck pain 09/21/2010   Cramps, muscle, general 09/21/2010   REFERRING PROVIDER: Malachy Chamber MD  REFERRING DIAG: Sprain of ligaments of lumbar spine.  Rationale for Evaluation and Treatment: Rehabilitation  THERAPY DIAG:  Other low back pain  Muscle spasm of back  ONSET DATE: 02/03/23.  SUBJECTIVE:                                                                                                                                                                                           SUBJECTIVE STATEMENT: Patient reports that he has been feeling better and able to do more at home   PERTINENT HISTORY:  Prior lumbar fusion, 05/18/15: (patient states has done very well since his surgery).  PAIN:  Are you having pain? 2-3/10, bilateral LB, right > left.  Also, c/o of mid-back and neck.   PRECAUTIONS: None  RED FLAGS: None   WEIGHT BEARING RESTRICTIONS: No  FALLS:  Has patient fallen in last 6 months? 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  DIAGNOSTIC FINDINGS:  03/03/23:   Sequela of L4-S1 decompressive laminectomies and instrumented  posterior fusion again noted. Suggestion of interval osseous fusion across the L5-S1 disc and at least partial incorporation of interbody graft at this level with development of adjacent deformity of the inferior half of the posterior L5 cortex with persistent grade I anterolisthesis of L5 on S1 contributes to at least moderate left greater than right neural foraminal stenosis at this level.  2. The right S1 pedicle screw appears to extend medial to the pedicle cortex and exert mass effect on the exiting right S1 nerve root.  3. Multilevel otherwise mild lumbar spondylosis contributing to multilevel  otherwise mild neural foraminal stenoses and effacement of subarticular recesses without significant spinal canal compromise otherwise detailed above    POSTURE: No Significant postural limitations  PALPATION: Tender to palpation over bilateral lumbar paraspinal musculature, right > left and over his right SIJ.  LUMBAR ROM:  Active lumbar flexion decreased by 50% and extension to 10 degrees.  LOWER EXTREMITY MMT:    Normal LE strength.  LUMBAR SPECIAL TESTS:  Equal leg lengths.  Pain reproduction with a right SLR test. GAIT: WNL.  TODAY'S TREATMENT:                                                                                                                              DATE:                                    04/25/23 EXERCISE LOG  Exercise Repetitions and Resistance Comments  Nustep  L4 x 16 minutes   Standing donkey kick     Standing hip ABD  Alternatign LE  Static stance on foam   Without UE support   Wall squat     Squat  15 reps with focus on hinge bending   Standing bird dog Leg and ,opp.  Arm  raise x 10 reps , each w/ 5 second hold , x 4   XTS blue ROW 2 X 10 hold 5 secs   XTS blue X 10 hold 5 secs    Blank cell = exercise not performed today  HEP Rows and extension added with blue band                                     04/15/23 EXERCISE LOG     Exercise Repetitions  and Resistance Comments  Nustep  L3 x 15 minutes   Isometric ball press     Lower trunk rotation    Bent knee fall out   Alternating LE   Standing modified bird-dog Leg raise x 6 each side hold 5 secs. Leg raise x 6 each side hold 5 secs    Blank cell = exercise not performed today  Handout given for HEP  modified bird-dog reps/sets Manual Therapy Soft Tissue Mobilization: RT  lumbar paraspinals and QL, for reduced pain and tone in LT side lying Joint Mobilizations:  ,       PATIENT EDUCATION:  Education details: sitting posture and lifting mechanics Person educated: patient Education method: verbal Education comprehension: patient reported understand    HOME EXERCISE PROGRAM:   ASSESSMENT:  CLINICAL IMPRESSION: Patient  arrived today doing fairly well and was able to continue with core activation exs with progressions. Pt to perform standingbird-dog 6,4,2  reps hold 10 secs now. LTG's are on gong due to pain levels. He reports ADL's getting easier.  OBJECTIVE IMPAIRMENTS: decreased activity tolerance, decreased ROM, increased muscle spasms, and pain.   ACTIVITY LIMITATIONS: sitting and standing  PERSONAL FACTORS: 1 comorbidity: prior lumbar fusion  are also affecting patient's functional outcome.   REHAB POTENTIAL: Good  CLINICAL DECISION MAKING: Stable/uncomplicated  EVALUATION COMPLEXITY: Low   GOALS:  LONG TERM GOALS: Target date: 05/16/23.  Ind with a HEP.  Goal status: On going  2.  Stand 30 minutes with pain not > 2-3/10.  Goal status: On going  3.  Perform ADL's with pain not > 2-3/10.  Goal status: On going PLAN:  PT FREQUENCY: 2x/week  PT DURATION: 6 weeks  PLANNED INTERVENTIONS: 97110-Therapeutic exercises, 97530- Therapeutic activity, [id number]- Neuromuscular re-education, 97535- Self Care, 10272- Manual therapy, 97014- Electrical stimulation (unattended), 97035- Ultrasound, Patient/Family education, Cryotherapy, and Moist heat.  PLAN FOR NEXT  SESSION: Combo e'stim/US, STW/M, core exercise progression, spinal protection techniques and body mechanics training.    Eleanor Dimichele,CHRIS, PTA 04/25/2023, 8:54 AM

## 2023-05-01 ENCOUNTER — Encounter: Payer: Self-pay | Admitting: *Deleted

## 2023-05-01 ENCOUNTER — Ambulatory Visit: Payer: No Typology Code available for payment source | Admitting: *Deleted

## 2023-05-01 DIAGNOSIS — M62830 Muscle spasm of back: Secondary | ICD-10-CM

## 2023-05-01 DIAGNOSIS — M5459 Other low back pain: Secondary | ICD-10-CM | POA: Diagnosis not present

## 2023-05-01 NOTE — Therapy (Signed)
OUTPATIENT PHYSICAL THERAPY THORACOLUMBAR TREATMENT   Patient Name: Gilbert Howard MRN: [id number] DOB:04/07/1981, 42 y.o., male Today's Date: 05/01/2023  END OF SESSION:  PT End of Session - 05/01/23 0813     Visit Number 7    Number of Visits 12    Date for PT Re-Evaluation 05/16/23    PT Start Time 0800               History reviewed. No pertinent past medical history. Past Surgical History:  Procedure Laterality Date   SPINAL FUSION     Patient Active Problem List   Diagnosis Date Noted   Acute bronchitis 12/19/2015   Mold exposure 12/19/2015   Neck pain 09/21/2010   Cramps, muscle, general 09/21/2010   REFERRING PROVIDER: Malachy Chamber MD  REFERRING DIAG: Sprain of ligaments of lumbar spine.  Rationale for Evaluation and Treatment: Rehabilitation  THERAPY DIAG:  Other low back pain  Muscle spasm of back  ONSET DATE: 02/03/23.  SUBJECTIVE:                                                                                                                                                                                           SUBJECTIVE STATEMENT: Patient reports that he has been feeling better and able to do more at home . 2/10 pain today  PERTINENT HISTORY:  Prior lumbar fusion, 05/18/15: (patient states has done very well since his surgery).  PAIN:  Are you having pain? 2-3/10, bilateral LB, right > left.  Also, c/o of mid-back and neck.   PRECAUTIONS: None  RED FLAGS: None   WEIGHT BEARING RESTRICTIONS: No  FALLS:  Has patient fallen in last 6 months? 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  DIAGNOSTIC FINDINGS:  03/03/23:   Sequela of L4-S1 decompressive laminectomies and instrumented posterior fusion again noted. Suggestion  of interval osseous fusion across the L5-S1 disc and at least partial incorporation of interbody graft at this level with development of adjacent deformity of the inferior half of the posterior L5 cortex with persistent grade I anterolisthesis of L5 on S1 contributes to at least moderate left greater than right neural foraminal stenosis at this level.  2. The right S1 pedicle screw appears to extend medial to the pedicle cortex and exert mass effect on the exiting right S1 nerve root.  3. Multilevel otherwise mild lumbar spondylosis contributing to multilevel otherwise mild neural foraminal stenoses and effacement of subarticular recesses without significant spinal  canal compromise otherwise detailed above    POSTURE: No Significant postural limitations  PALPATION: Tender to palpation over bilateral lumbar paraspinal musculature, right > left and over his right SIJ.  LUMBAR ROM:  Active lumbar flexion decreased by 50% and extension to 10 degrees.  LOWER EXTREMITY MMT:    Normal LE strength.  LUMBAR SPECIAL TESTS:  Equal leg lengths.  Pain reproduction with a right SLR test. GAIT: WNL.  TODAY'S TREATMENT:                                                                                                                              DATE:                                    05/01/23 EXERCISE LOG    LB  Exercise Repetitions and Resistance Comments  Nustep  L4 x 16 minutes   Standing donkey kick     Standing hip ABD  Alternatign LE  Static stance on foam   Without UE support   SOC X 10 for pelvic control   Squat  15 reps with focus on hinge bending   Standing bird dog Leg and opp.  Arm  raise x 6 reps , each w/ 5 second hold , x 4,2   XTS blue ROW    XTS blue X 10 hold 5 secs   Bridge X10  hold 10 secs   Dying bug X 6 hold 10 secs each side   Cat /camel X10 very challenging    Blank cell = exercise not performed today  HEP Rows and extension added with blue band                                      04/15/23 EXERCISE LOG     Exercise Repetitions and Resistance Comments  Nustep  L3 x 15 minutes   Isometric ball press     Lower trunk rotation    Bent knee fall out   Alternating LE   Standing modified bird-dog Leg raise x 6 each side hold 5 secs. Leg raise x 6 each side hold 5 secs    Blank cell = exercise not performed today  Handout given for HEP  modified bird-dog reps/sets Manual Therapy Soft Tissue Mobilization: RT  lumbar paraspinals and QL, for reduced pain and tone in LT side lying Joint Mobilizations:  ,       PATIENT EDUCATION:  Education details: sitting posture and lifting mechanics Person educated: patient Education method: verbal Education comprehension: patient reported understand    HOME EXERCISE PROGRAM:   ASSESSMENT:  CLINICAL IMPRESSION: Patient  arrived today doing fairly well with 2/10 LBP. He was able to continue with core exs with progressions in standing and on mat. Pt to perform standingbird-dog 6,4,2  reps hold 10 secs still and add SOC for pelvic control wjich was challenging for Pt  LTG's are on giong due to pain levels. He reports ADL's getting easier.   OBJECTIVE IMPAIRMENTS: decreased activity tolerance, decreased ROM, increased muscle spasms, and pain.   ACTIVITY LIMITATIONS: sitting and standing  PERSONAL FACTORS: 1 comorbidity: prior lumbar fusion  are also affecting patient's functional outcome.   REHAB POTENTIAL: Good  CLINICAL DECISION MAKING: Stable/uncomplicated  EVALUATION COMPLEXITY: Low   GOALS:  LONG TERM GOALS: Target date: 05/16/23.  Ind with a HEP.  Goal status: On going  2.  Stand 30 minutes with pain not > 2-3/10.  Goal status: On going  3.  Perform ADL's with pain not > 2-3/10.  Goal status: On going PLAN:  PT FREQUENCY: 2x/week  PT DURATION: 6 weeks  PLANNED INTERVENTIONS: 97110-Therapeutic exercises, 97530- Therapeutic activity, [id number]- Neuromuscular re-education, 97535- Self Care,  97140- Manual therapy, 97014- Electrical stimulation (unattended), 97035- Ultrasound, Patient/Family education, Cryotherapy, and Moist heat.  PLAN FOR NEXT SESSION: Combo e'stim/US, STW/M, core exercise progression, spinal protection techniques and body mechanics training.    Zadiel Leyh,CHRIS, PTA 05/01/2023, 8:14 AM

## 2023-05-03 ENCOUNTER — Ambulatory Visit: Payer: No Typology Code available for payment source | Admitting: *Deleted

## 2023-05-07 ENCOUNTER — Encounter: Payer: Self-pay | Admitting: *Deleted

## 2023-05-07 ENCOUNTER — Ambulatory Visit: Payer: Self-pay | Admitting: *Deleted

## 2023-05-07 DIAGNOSIS — M62830 Muscle spasm of back: Secondary | ICD-10-CM

## 2023-05-07 DIAGNOSIS — M5459 Other low back pain: Secondary | ICD-10-CM

## 2023-05-07 NOTE — Therapy (Signed)
OUTPATIENT PHYSICAL THERAPY THORACOLUMBAR TREATMENT   Patient Name: Gilbert Howard MRN: [id number] DOB:05-Feb-1981, 42 y.o., male Today's Date: 05/07/2023  END OF SESSION:  PT End of Session - 05/07/23 1353     Visit Number 8    Number of Visits 12    Date for PT Re-Evaluation 05/16/23    PT Start Time 1345    PT Stop Time 1435    PT Time Calculation (min) 50 min               History reviewed. No pertinent past medical history. Past Surgical History:  Procedure Laterality Date   SPINAL FUSION     Patient Active Problem List   Diagnosis Date Noted   Acute bronchitis 12/19/2015   Mold exposure 12/19/2015   Neck pain 09/21/2010   Cramps, muscle, general 09/21/2010   REFERRING PROVIDER: Malachy Chamber MD  REFERRING DIAG: Sprain of ligaments of lumbar spine.  Rationale for Evaluation and Treatment: Rehabilitation  THERAPY DIAG:  Other low back pain  Muscle spasm of back  ONSET DATE: 02/03/23.  SUBJECTIVE:                                                                                                                                                                                           SUBJECTIVE STATEMENT: Patient reports that he has been feeling better and able to do more at home .1-2/10 pain today  PERTINENT HISTORY:  Prior lumbar fusion, 05/18/15: (patient states has done very well since his surgery).  PAIN:  Are you having pain? 2-3/10, bilateral LB, right > left.  Also, c/o of mid-back and neck.   PRECAUTIONS: None  RED FLAGS: None   WEIGHT BEARING RESTRICTIONS: No  FALLS:  Has patient fallen in last 6 months? 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  DIAGNOSTIC FINDINGS:  03/03/23:   Sequela of L4-S1 decompressive  laminectomies and instrumented posterior fusion again noted. Suggestion of interval osseous fusion across the L5-S1 disc and at least partial incorporation of interbody graft at this level with development of adjacent deformity of the inferior half of the posterior L5 cortex with persistent grade I anterolisthesis of L5 on S1 contributes to at least moderate left greater than right neural foraminal stenosis at this level.  2. The right S1 pedicle screw appears to extend medial to the pedicle cortex and exert mass effect on the exiting right S1 nerve root.  3. Multilevel otherwise mild lumbar spondylosis contributing  to multilevel otherwise mild neural foraminal stenoses and effacement of subarticular recesses without significant spinal canal compromise otherwise detailed above    POSTURE: No Significant postural limitations  PALPATION: Tender to palpation over bilateral lumbar paraspinal musculature, right > left and over his right SIJ.  LUMBAR ROM:  Active lumbar flexion decreased by 50% and extension to 10 degrees.  LOWER EXTREMITY MMT:    Normal LE strength.  LUMBAR SPECIAL TESTS:  Equal leg lengths.  Pain reproduction with a right SLR test. GAIT: WNL.  TODAY'S TREATMENT:                                                                                                                              DATE:                                    05/07/23 EXERCISE   LOG LB  Exercise Repetitions and Resistance Comments  Nustep  L6 x 18 minutes   Standing donkey kick     Standing hip ABD  Alternatign LE  Static stance on foam   Without UE support   SOC    Squat  15 reps with focus on hinge bending   Standing bird dog Leg and opp.  Arm  raise x 10 reps , each w/ 10 second hold ,   XTS blue EXT X10   hold 10 secs   XTS blue Lat pulldown X 10 hold 10 secs   Bridge X10  hold 10 secs   Dying bug X 6 hold 10 secs each side   Cat /camel     Blank cell = exercise not performed today   HEP  Rows and extension added with blue band                                     04/15/23 EXERCISE LOG     Exercise Repetitions and Resistance Comments  Nustep  L3 x 15 minutes   Isometric ball press     Lower trunk rotation    Bent knee fall out   Alternating LE   Standing modified bird-dog Leg raise x 6 each side hold 5 secs. Leg raise x 6 each side hold 5 secs    Blank cell = exercise not performed today  Handout given for HEP  modified bird-dog reps/sets Manual Therapy Soft Tissue Mobilization: RT  lumbar paraspinals and QL, for reduced pain and tone in LT side lying Joint Mobilizations:  ,       PATIENT EDUCATION:  Education details: sitting posture and lifting mechanics Person educated: patient Education method: verbal Education comprehension: patient reported understand    HOME EXERCISE PROGRAM:   ASSESSMENT:  CLINICAL IMPRESSION: Patient  arrived today doing fairly well with 2/10 LBP. He was able to continue with  core exs and progressions with mainly MM tightness end of session.     OBJECTIVE IMPAIRMENTS: decreased activity tolerance, decreased ROM, increased muscle spasms, and pain.   ACTIVITY LIMITATIONS: sitting and standing  PERSONAL FACTORS: 1 comorbidity: prior lumbar fusion  are also affecting patient's functional outcome.   REHAB POTENTIAL: Good  CLINICAL DECISION MAKING: Stable/uncomplicated  EVALUATION COMPLEXITY: Low   GOALS:  LONG TERM GOALS: Target date: 05/16/23.  Ind with a HEP.  Goal status: MET  2.  Stand 30 minutes with pain not > 2-3/10.  Goal status: Partially met  3.  Perform ADL's with pain not > 2-3/10.  Goal status: Partially met PLAN:  PT FREQUENCY: 2x/week  PT DURATION: 6 weeks  PLANNED INTERVENTIONS: 97110-Therapeutic exercises, 97530- Therapeutic activity, [id number]- Neuromuscular re-education, 97535- Self Care, [id number]- Manual therapy, 97014- Electrical stimulation (unattended), 97035- Ultrasound, Patient/Family education,  Cryotherapy, and Moist heat.  PLAN FOR NEXT SESSION: Combo e'stim/US, STW/M, core exercise progression.    Chari Parmenter,CHRIS, PTA 05/07/2023, 2:45 PM

## 2023-05-09 ENCOUNTER — Ambulatory Visit: Payer: Self-pay | Admitting: *Deleted

## 2023-05-09 DIAGNOSIS — M5459 Other low back pain: Secondary | ICD-10-CM | POA: Diagnosis not present

## 2023-05-09 DIAGNOSIS — M62830 Muscle spasm of back: Secondary | ICD-10-CM

## 2023-05-09 NOTE — Therapy (Signed)
OUTPATIENT PHYSICAL THERAPY THORACOLUMBAR TREATMENT   Patient Name: Gilbert Howard MRN: [id number] DOB:15-Dec-1980, 42 y.o., male Today's Date: 05/09/2023  END OF SESSION:  PT End of Session - 05/09/23 1347     Visit Number 9    Number of Visits 12    Date for PT Re-Evaluation 05/16/23    PT Start Time 1345    PT Stop Time 1428    PT Time Calculation (min) 43 min               History reviewed. No pertinent past medical history. Past Surgical History:  Procedure Laterality Date   SPINAL FUSION     Patient Active Problem List   Diagnosis Date Noted   Acute bronchitis 12/19/2015   Mold exposure 12/19/2015   Neck pain 09/21/2010   Cramps, muscle, general 09/21/2010   REFERRING PROVIDER: Malachy Chamber MD  REFERRING DIAG: Sprain of ligaments of lumbar spine.  Rationale for Evaluation and Treatment: Rehabilitation  THERAPY DIAG:  Other low back pain  Muscle spasm of back  ONSET DATE: 02/03/23.  SUBJECTIVE:                                                                                                                                                                                           SUBJECTIVE STATEMENT: Pt denies any pain today.   PERTINENT HISTORY:  Prior lumbar fusion, 05/18/15: (patient states has done very well since his surgery).  PAIN:  Are you having pain? No  PRECAUTIONS: None  RED FLAGS: None   WEIGHT BEARING RESTRICTIONS: No  FALLS:  Has patient fallen in last 6 months? 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  DIAGNOSTIC FINDINGS:  03/03/23:   Sequela of L4-S1 decompressive laminectomies and instrumented posterior fusion again noted. Suggestion of interval osseous fusion across the L5-S1 disc and at least partial  incorporation of interbody graft at this level with development of adjacent deformity of the inferior half of the posterior L5 cortex with persistent grade I anterolisthesis of L5 on S1 contributes to at least moderate left greater than right neural foraminal stenosis at this level.  2. The right S1 pedicle screw appears to extend medial to the pedicle cortex and exert mass effect on the exiting right S1 nerve root.  3. Multilevel otherwise mild lumbar spondylosis contributing to multilevel otherwise mild neural foraminal stenoses and effacement of subarticular recesses without significant spinal canal compromise otherwise detailed above    POSTURE: No  Significant postural limitations  PALPATION: Tender to palpation over bilateral lumbar paraspinal musculature, right > left and over his right SIJ.  LUMBAR ROM:  Active lumbar flexion decreased by 50% and extension to 10 degrees.  LOWER EXTREMITY MMT:    Normal LE strength.  LUMBAR SPECIAL TESTS:  Equal leg lengths.  Pain reproduction with a right SLR test. GAIT: WNL.  TODAY'S TREATMENT:                                                                                                                              DATE:                                    05/09/23 EXERCISE   LOG LB  Exercise Repetitions and Resistance Comments  Nustep  L6 x 15 minutes   Standing donkey kick     Standing hip ABD 2# x 25 reps bil  Alternatign LE  Static stance on foam   Without UE support   SOC    Squat     Standing bird dog    XTS blue EXT X15  hold 10 secs   XTS blue Lat pulldown X15 hold 10 secs   Cybex Knee Extension 20# x 3 mins   Cybex Knee Flexion 40# x 3 mins   Cybex Leg Press 2 plates; seat 8 x 3 mins    Blank cell = exercise not performed today                                     04/15/23 EXERCISE LOG     Exercise Repetitions and Resistance Comments  Nustep  L3 x 15 minutes   Isometric ball press     Lower trunk rotation    Bent  knee fall out   Alternating LE   Standing modified bird-dog Leg raise x 6 each side hold 5 secs. Leg raise x 6 each side hold 5 secs    Blank cell = exercise not performed today  Handout given for HEP  modified bird-dog reps/sets Manual Therapy Soft Tissue Mobilization: RT  lumbar paraspinals and QL, for reduced pain and tone in LT side lying Joint Mobilizations:  ,       PATIENT EDUCATION:  Education details: sitting posture and lifting mechanics Person educated: patient Education method: verbal Education comprehension: patient reported understand    HOME EXERCISE PROGRAM:   ASSESSMENT:  CLINICAL IMPRESSION: Pt arrives for today's treatment session denying any pain.  Pt able to tolerate increased reps with XTS exercises without pain or discomfort.  Pt introduced to cybex LE machinery today with good results.  Pt requiring min cues for proper technique and eccentric control.  Pt also able to tolerate addition of 2# weight with standing hip abduction.  Pt denied any pain  at completion of today's treatment session.   OBJECTIVE IMPAIRMENTS: decreased activity tolerance, decreased ROM, increased muscle spasms, and pain.   ACTIVITY LIMITATIONS: sitting and standing  PERSONAL FACTORS: 1 comorbidity: prior lumbar fusion  are also affecting patient's functional outcome.   REHAB POTENTIAL: Good  CLINICAL DECISION MAKING: Stable/uncomplicated  EVALUATION COMPLEXITY: Low   GOALS:  LONG TERM GOALS: Target date: 05/16/23.  Ind with a HEP.  Goal status: MET  2.  Stand 30 minutes with pain not > 2-3/10.  Goal status: Partially met  3.  Perform ADL's with pain not > 2-3/10.  Goal status: Partially met PLAN:  PT FREQUENCY: 2x/week  PT DURATION: 6 weeks  PLANNED INTERVENTIONS: 97110-Therapeutic exercises, 97530- Therapeutic activity, [id number]- Neuromuscular re-education, 97535- Self Care, 78469- Manual therapy, 97014- Electrical stimulation (unattended), 97035- Ultrasound,  Patient/Family education, Cryotherapy, and Moist heat.  PLAN FOR NEXT SESSION: Combo e'stim/US, STW/M, core exercise progression.   Newman Pies, PTA 05/09/2023, 2:52 PM

## 2023-05-14 ENCOUNTER — Ambulatory Visit: Payer: No Typology Code available for payment source

## 2023-05-14 DIAGNOSIS — M5459 Other low back pain: Secondary | ICD-10-CM

## 2023-05-14 DIAGNOSIS — M62830 Muscle spasm of back: Secondary | ICD-10-CM

## 2023-05-14 NOTE — Therapy (Addendum)
OUTPATIENT PHYSICAL THERAPY THORACOLUMBAR TREATMENT   Patient Name: Gilbert Howard MRN: [id number] DOB:Jun 03, 1980, 42 y.o., male Today's Date: 05/14/2023  END OF SESSION:  PT End of Session - 05/14/23 1018     Visit Number 10    Number of Visits 12    Date for PT Re-Evaluation 05/16/23    PT Start Time 1015    PT Stop Time 1057    PT Time Calculation (min) 42 min               History reviewed. No pertinent past medical history. Past Surgical History:  Procedure Laterality Date   SPINAL FUSION     Patient Active Problem List   Diagnosis Date Noted   Acute bronchitis 12/19/2015   Mold exposure 12/19/2015   Neck pain 09/21/2010   Cramps, muscle, general 09/21/2010   REFERRING PROVIDER: Malachy Chamber MD  REFERRING DIAG: Sprain of ligaments of lumbar spine.  Rationale for Evaluation and Treatment: Rehabilitation  THERAPY DIAG:  Other low back pain  Muscle spasm of back  ONSET DATE: 02/03/23.  SUBJECTIVE:                                                                                                                                                                                           SUBJECTIVE STATEMENT: Pt denies any pain today.   PERTINENT HISTORY:  Prior lumbar fusion, 05/18/15: (patient states has done very well since his surgery).  PAIN:  Are you having pain? No  PRECAUTIONS: None  RED FLAGS: None   WEIGHT BEARING RESTRICTIONS: No  FALLS:  Has patient fallen in last 6 months? 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  DIAGNOSTIC FINDINGS:  03/03/23:   Sequela of L4-S1 decompressive laminectomies and instrumented posterior fusion again noted. Suggestion of interval osseous fusion across the L5-S1 disc and at least partial  incorporation of interbody graft at this level with development of adjacent deformity of the inferior half of the posterior L5 cortex with persistent grade I anterolisthesis of L5 on S1 contributes to at least moderate left greater than right neural foraminal stenosis at this level.  2. The right S1 pedicle screw appears to extend medial to the pedicle cortex and exert mass effect on the exiting right S1 nerve root.  3. Multilevel otherwise mild lumbar spondylosis contributing to multilevel otherwise mild neural foraminal stenoses and effacement of subarticular recesses without significant spinal canal compromise otherwise detailed above    POSTURE: No  Significant postural limitations  PALPATION: Tender to palpation over bilateral lumbar paraspinal musculature, right > left and over his right SIJ.  LUMBAR ROM:  Active lumbar flexion decreased by 50% and extension to 10 degrees.  LOWER EXTREMITY MMT:    Normal LE strength.  LUMBAR SPECIAL TESTS:  Equal leg lengths.  Pain reproduction with a right SLR test. GAIT: WNL.  TODAY'S TREATMENT:                                                                                                                              DATE:                                    05/14/23 EXERCISE   LOG LB  Exercise Repetitions and Resistance Comments  Nustep  L6 x 15 minutes   Standing donkey kick     Standing hip ABD  Alternatign LE  Static stance on foam   Without UE support   SOC    Squat     Standing bird dog    XTS blue EXT X15  hold 10 secs   XTS blue Lat pulldown X15 hold 10 secs   Cybex Knee Extension 20# x 4 mins   Cybex Knee Flexion 40# x 4 mins   Cybex Leg Press 2 plates; seat 8 x 3 mins    Blank cell = exercise not performed today                                     04/15/23 EXERCISE LOG     Exercise Repetitions and Resistance Comments  Nustep  L3 x 15 minutes   Isometric ball press     Lower trunk rotation    Bent knee fall out    Alternating LE   Standing modified bird-dog Leg raise x 6 each side hold 5 secs. Leg raise x 6 each side hold 5 secs    Blank cell = exercise not performed today  Handout given for HEP  modified bird-dog reps/sets Manual Therapy Soft Tissue Mobilization: RT  lumbar paraspinals and QL, for reduced pain and tone in LT side lying Joint Mobilizations:  ,       PATIENT EDUCATION:  Education details: sitting posture and lifting mechanics Person educated: patient Education method: verbal Education comprehension: patient reported understand    HOME EXERCISE PROGRAM:   ASSESSMENT:  CLINICAL IMPRESSION: Pt arrives for today's treatment session denying any pain.  Pt reports mild soreness after last treatment session, but no real pain.  Pt able to tolerate increased time with all cybex exercises today as well as Nustep without issue or complaint of pain.  Pt reports that his pain level and ability to stand are directly related to the activities performed that day.  Pt is making good  progress towards all of his goals at this time.  Pt denied any pain at completion of today's treatment session.   OBJECTIVE IMPAIRMENTS: decreased activity tolerance, decreased ROM, increased muscle spasms, and pain.   ACTIVITY LIMITATIONS: sitting and standing  PERSONAL FACTORS: 1 comorbidity: prior lumbar fusion  are also affecting patient's functional outcome.   REHAB POTENTIAL: Good  CLINICAL DECISION MAKING: Stable/uncomplicated  EVALUATION COMPLEXITY: Low   GOALS:  LONG TERM GOALS: Target date: 05/16/23.  Ind with a HEP.  Goal status: MET  2.  Stand 30 minutes with pain not > 2-3/10.  Goal status: Partially met  3.  Perform ADL's with pain not > 2-3/10.  Goal status: Partially met PLAN:  PT FREQUENCY: 2x/week  PT DURATION: 6 weeks  PLANNED INTERVENTIONS: 97110-Therapeutic exercises, 97530- Therapeutic activity, [id number]- Neuromuscular re-education, 97535- Self Care, [id number]- Manual therapy,  97014- Electrical stimulation (unattended), 97035- Ultrasound, Patient/Family education, Cryotherapy, and Moist heat.  PLAN FOR NEXT SESSION: Combo e'stim/US, STW/M, core exercise progression.  Newman Pies, PTA 05/14/2023, 11:02 AM   Progress Note Reporting Period 04/04/23 to 05/14/23  See note below for Objective Data and Assessment of Progress/Goals. Patient progressing well toward goals with no pain reported.    [place]aly Applegate MPT

## 2023-05-16 ENCOUNTER — Ambulatory Visit: Payer: No Typology Code available for payment source

## 2023-05-16 DIAGNOSIS — M5459 Other low back pain: Secondary | ICD-10-CM

## 2023-05-16 DIAGNOSIS — M62830 Muscle spasm of back: Secondary | ICD-10-CM

## 2023-05-16 NOTE — Therapy (Signed)
OUTPATIENT PHYSICAL THERAPY THORACOLUMBAR TREATMENT   Patient Name: Gilbert Howard MRN: [id number] DOB:[birth year], 42 y.o., male Today's Date: 05/16/2023  END OF SESSION:  PT End of Session - 05/16/23 1104     Visit Number 11    Number of Visits 12    Date for PT Re-Evaluation 05/16/23    PT Start Time 1100    PT Stop Time 1138    PT Time Calculation (min) 38 min               History reviewed. No pertinent past medical history. Past Surgical History:  Procedure Laterality Date   SPINAL FUSION     Patient Active Problem List   Diagnosis Date Noted   Acute bronchitis 12/19/2015   Mold exposure 12/19/2015   Neck pain 09/21/2010   Cramps, muscle, general 09/21/2010   REFERRING PROVIDER: Malachy Chamber MD  REFERRING DIAG: Sprain of ligaments of lumbar spine.  Rationale for Evaluation and Treatment: Rehabilitation  THERAPY DIAG:  Other low back pain  Muscle spasm of back  ONSET DATE: 02/03/23.  SUBJECTIVE:                                                                                                                                                                                           SUBJECTIVE STATEMENT: Pt denies any pain today.   PERTINENT HISTORY:  Prior lumbar fusion, 05/18/15: (patient states has done very well since his surgery).  PAIN:  Are you having pain? No  PRECAUTIONS: None  RED FLAGS: None   WEIGHT BEARING RESTRICTIONS: No  FALLS:  Has patient fallen in last 6 months? 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  DIAGNOSTIC FINDINGS:  03/03/23:   Sequela of L4-S1 decompressive laminectomies and instrumented posterior fusion again noted. Suggestion of interval osseous fusion across the L5-S1 disc and at least partial  incorporation of interbody graft at this level with development of adjacent deformity of the inferior half of the posterior L5 cortex with persistent grade I anterolisthesis of L5 on S1 contributes to at least moderate left greater than right neural foraminal stenosis at this level.  2. The right S1 pedicle screw appears to extend medial to the pedicle cortex and exert mass effect on the exiting right S1 nerve root.  3. Multilevel otherwise mild lumbar spondylosis contributing to multilevel otherwise mild neural foraminal stenoses and effacement of subarticular recesses without significant spinal canal compromise otherwise detailed above    POSTURE: No  Significant postural limitations  PALPATION: Tender to palpation over bilateral lumbar paraspinal musculature, right > left and over his right SIJ.  LUMBAR ROM:  Active lumbar flexion decreased by 50% and extension to 10 degrees.  LOWER EXTREMITY MMT:    Normal LE strength.  LUMBAR SPECIAL TESTS:  Equal leg lengths.  Pain reproduction with a right SLR test. GAIT: WNL.  TODAY'S TREATMENT:                                                                                                                              DATE:                                    05/16/23 EXERCISE   LOG LB  Exercise Repetitions and Resistance Comments  Recumbent Bike L3 x 15 mins   Standing donkey kick     Standing hip ABD  Alternatign LE  Static stance on foam   Without UE support   SOC    Squat     Standing bird dog    XTS blue EXT    XTS blue Lat pulldown    Cybex Knee Extension 30# x 3 mins   Cybex Knee Flexion 50# x 3 mins   Cybex Leg Press 2.5 plates; seat 8 x 3 mins    Blank cell = exercise not performed today                                     04/15/23 EXERCISE LOG     Exercise Repetitions and Resistance Comments  Nustep  L3 x 15 minutes   Isometric ball press     Lower trunk rotation    Bent knee fall out   Alternating LE   Standing  modified bird-dog Leg raise x 6 each side hold 5 secs. Leg raise x 6 each side hold 5 secs    Blank cell = exercise not performed today  Handout given for HEP  modified bird-dog reps/sets Manual Therapy Soft Tissue Mobilization: RT  lumbar paraspinals and QL, for reduced pain and tone in LT side lying Joint Mobilizations:  ,       PATIENT EDUCATION:  Education details: sitting posture and lifting mechanics Person educated: patient Education method: verbal Education comprehension: patient reported understand    HOME EXERCISE PROGRAM:   ASSESSMENT:  CLINICAL IMPRESSION: Pt arrives for today's treatment session denying any pain.  Pt has met all the goals set forth for him at this time.  Pt is able to perform ADL's and standing greater than 30 mins without pain exceeding 3/10.  Pt able to tolerate transition to recumbent bike today without any pain or discomfort.  Pt also able to tolerate increased weight with all cybex exercises today.  Pt encouraged to call the facility with  any questions or concerns.  Pt denied any pain at completion of today's treatment session.  Pt ready for discharge at this time.   OBJECTIVE IMPAIRMENTS: decreased activity tolerance, decreased ROM, increased muscle spasms, and pain.   ACTIVITY LIMITATIONS: sitting and standing  PERSONAL FACTORS: 1 comorbidity: prior lumbar fusion  are also affecting patient's functional outcome.   REHAB POTENTIAL: Good  CLINICAL DECISION MAKING: Stable/uncomplicated  EVALUATION COMPLEXITY: Low   GOALS:  LONG TERM GOALS: Target date: 05/16/23.  Ind with a HEP.  Goal status: MET  2.  Stand 30 minutes with pain not > 2-3/10.  Goal status: MET  3.  Perform ADL's with pain not > 2-3/10.  Goal status: MET PLAN:  PT FREQUENCY: 2x/week  PT DURATION: 6 weeks  PLANNED INTERVENTIONS: 97110-Therapeutic exercises, 97530- Therapeutic activity, [id number]- Neuromuscular re-education, 97535- Self Care, 95621- Manual therapy,  97014- Electrical stimulation (unattended), 97035- Ultrasound, Patient/Family education, Cryotherapy, and Moist heat.  PLAN FOR NEXT SESSION: Combo e'stim/US, STW/M, core exercise progression.  Newman Pies, PTA 05/16/2023, 11:43 AM
